# Patient Record
Sex: Male | Born: 1995 | Race: Black or African American | Hispanic: No | Marital: Single | Smoking: Never smoker
Health system: Southern US, Community
[De-identification: ages and names within clinical notes are randomized; demographics above are authoritative.]

## PROBLEM LIST (undated history)

## (undated) DIAGNOSIS — J45909 Unspecified asthma, uncomplicated: Secondary | ICD-10-CM

## (undated) DIAGNOSIS — E669 Obesity, unspecified: Secondary | ICD-10-CM

---
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
## 2018-11-12 ENCOUNTER — Encounter (HOSPITAL_COMMUNITY): Payer: Self-pay

## 2018-11-12 ENCOUNTER — Ambulatory Visit (HOSPITAL_COMMUNITY)
Admission: EM | Admit: 2018-11-12 | Discharge: 2018-11-12 | Disposition: A | Discharge status: Home / Self Care | Payer: PRIVATE HEALTH INSURANCE | Attending: Family Medicine | Admitting: Family Medicine

## 2018-11-12 ENCOUNTER — Other Ambulatory Visit: Payer: Self-pay

## 2018-11-12 DIAGNOSIS — F418 Other specified anxiety disorders: Secondary | ICD-10-CM | POA: Diagnosis not present

## 2018-11-12 DIAGNOSIS — J4520 Mild intermittent asthma, uncomplicated: Secondary | ICD-10-CM

## 2018-11-12 MED ORDER — ALBUTEROL SULFATE (2.5 MG/3ML) 0.083% IN NEBU: 2.5000 mg | INHALATION_SOLUTION | Freq: Four times a day (QID) | RESPIRATORY_TRACT | 1 refills | Status: AC | PRN

## 2018-11-12 NOTE — ED Triage Notes (Signed)
Patient presents to Urgent Care with complaints of feelings of anxiety, reports "not breathing right" since several days ago. Patient states he is not sleeping well either, was seen in past for same.

## 2018-11-14 NOTE — ED Provider Notes (Signed)
[facility]   [id number] 11/12/18 Arrival Time: 1647  ASSESSMENT & PLAN:  1. Situational anxiety   2. Mild intermittent asthma without complication    Meds ordered this encounter  Medications  . albuterol (PROVENTIL) (2.5 MG/3ML) 0.083% nebulizer solution    Sig: Take 3 mLs (2.5 mg total) by nebulization every 6 (six) hours as needed for wheezing or shortness of breath.    Dispense:  75 mL    Refill:  1   Declines sleep aid at this time. Encouraged to est care with PCP. May return here if needed.  Reviewed expectations re: course of current medical issues. Questions answered. Outlined signs and symptoms indicating need for more acute intervention. Patient verbalized understanding. After Visit Summary given.   SUBJECTIVE:  Mathew George is a 23 y.o. male who presents with complaint of:  Anxiety: Questions relation to recent COVID-19 news cycle. No significant h/o anxiety. But this is affecting his sleep. Usually able to fall asleep but wakes up and is unable to fall back asleep. No heavy alcohol use or drug use. No panic attacks. Onset of symptoms over the past week. No specific aggravating or alleviating factors reported. He denies current suicidal and homicidal ideation.  Also with mild intermittent asthma. Recent and mild exacerbation of wheezing with increase in pollen this week. No current SOB/CP. Requests refill of his albuterol inhaler.  ROS: As per HPI. All other systems negative.    OBJECTIVE:  Vitals:   11/12/18 1710 11/12/18 1712  BP:  (!) 147/87  Pulse:  88  Resp:  18  Temp:  99.1 F (37.3 C)  TempSrc:  Oral  SpO2:  100%  Weight: (!) 145.2 kg   Height: 6\' 1"  (1.854 m)     General appearance: alert; no distress Eyes: PERRLA; EOMI; conjunctiva normal HENT: normocephalic; atraumatic Neck: supple Lungs: clear to auscultation bilaterally; no active wheezing Heart: regular rate and rhythm Abdomen: soft, non-tender  Extremities: no edema;  symmetrical with no gross deformities Skin: warm and dry Neurologic: normal gait; normal symmetric reflexes Psychological: alert and cooperative; normal mood and affect  No Known Allergies  Past Medical History:  Diagnosis Date  . Asthma    Social History   Socioeconomic History  . Marital status: Single    Spouse name: Not on file  . Number of children: Not on file  . Years of education: Not on file  . Highest education level: Not on file  Occupational History  . Not on file  Social Needs  . Financial resource strain: Not on file  . Food insecurity:    Worry: Not on file    Inability: Not on file  . Transportation needs:    Medical: Not on file    Non-medical: Not on file  Tobacco Use  . Smoking status: Never Smoker  . Smokeless tobacco: Never Used  Substance and Sexual Activity  . Alcohol use: No  . Drug use: No  . Sexual activity: Not on file  Lifestyle  . Physical activity:    Days per week: Not on file    Minutes per session: Not on file  . Stress: Not on file  Relationships  . Social connections:    Talks on phone: Not on file    Gets together: Not on file    Attends religious service: Not on file    Active member of club or organization: Not on file    Attends meetings of clubs or organizations: Not on file  Relationship status: Not on file  . Intimate partner violence:    Fear of current or ex partner: Not on file    Emotionally abused: Not on file    Physically abused: Not on file    Forced sexual activity: Not on file  Other Topics Concern  . Not on file  Social History Narrative  . Not on file   Family History  Problem Relation Age of Onset  . Diabetes Mother    History reviewed. No pertinent surgical history.    Mardella Layman, MD 11/17/18 [phone]

## 2020-04-06 ENCOUNTER — Encounter (HOSPITAL_COMMUNITY): Payer: Self-pay | Admitting: *Deleted

## 2020-04-06 ENCOUNTER — Other Ambulatory Visit: Payer: Self-pay

## 2020-04-06 ENCOUNTER — Ambulatory Visit (HOSPITAL_COMMUNITY)
Admission: EM | Admit: 2020-04-06 | Discharge: 2020-04-06 | Disposition: A | Discharge status: Home / Self Care | Payer: PRIVATE HEALTH INSURANCE | Attending: Emergency Medicine | Admitting: Emergency Medicine

## 2020-04-06 DIAGNOSIS — H66002 Acute suppurative otitis media without spontaneous rupture of ear drum, left ear: Secondary | ICD-10-CM

## 2020-04-06 DIAGNOSIS — H6121 Impacted cerumen, right ear: Secondary | ICD-10-CM | POA: Diagnosis not present

## 2020-04-06 MED ORDER — AMOXICILLIN 500 MG PO CAPS: 500.0000 mg | ORAL_CAPSULE | Freq: Two times a day (BID) | ORAL | 0 refills | Status: AC

## 2020-04-06 MED ORDER — NEOMYCIN-POLYMYXIN-HC 3.5-10000-1 OT SUSP: 4.0000 [drp] | Freq: Three times a day (TID) | OTIC | 0 refills | Status: AC

## 2020-04-06 NOTE — Discharge Instructions (Signed)
Earwax removed Begin amoxicillin twice daily for the next 10 days to treat ear infection on left side May use Cortisporin eardrops to help with itching/prevent infection developing to outer ear canal May use over-the-counter Debrox for prevention of wax buildup Follow-up for any persistent or worsening symptoms

## 2020-04-06 NOTE — ED Triage Notes (Signed)
Patient reports right ear pain, states he feels like he has wax build up. Patient reports mild discomfort to left ear.   Denies nasal congestion or sore throat.

## 2020-04-07 NOTE — ED Provider Notes (Signed)
[facility]    CSN: [id number] Arrival date & time: 04/06/20  1538      History   Chief Complaint Chief Complaint  Patient presents with  . Otalgia    HPI Mathew George is a 24 y.o. male presenting today for evaluation of right ear pain.  Patient reports today he began to develop pain in his right ear feels as if he has wax buildup.  Reports history of similar.  Has mild discomfort in left ear.  Denies any recent URI symptoms of cough congestion or sore throat.  HPI  Past Medical History:  Diagnosis Date  . Asthma     There are no problems to display for this patient.   History reviewed. No pertinent surgical history.     Home Medications    Prior to Admission medications   Medication Sig Start Date End Date Taking? Authorizing Provider  albuterol (PROVENTIL) (2.5 MG/3ML) 0.083% nebulizer solution Take 3 mLs (2.5 mg total) by nebulization every 6 (six) hours as needed for wheezing or shortness of breath. 11/12/18   Mardella Layman, MD  amoxicillin (AMOXIL) 500 MG capsule Take 1 capsule (500 mg total) by mouth 2 (two) times daily for 10 days. 04/06/20 04/16/20  Roshad Hack C, PA-C  neomycin-polymyxin-hydrocortisone (CORTISPORIN) 3.5-10000-1 OTIC suspension Place 4 drops into both ears 3 (three) times daily for 7 days. 04/06/20 04/13/20  Yarnell Kozloski, Junius Creamer, PA-C    Family History Family History  Problem Relation Age of Onset  . Diabetes Mother     Social History Social History   Tobacco Use  . Smoking status: Never Smoker  . Smokeless tobacco: Never Used  Vaping Use  . Vaping Use: Never used  Substance Use Topics  . Alcohol use: No  . Drug use: No     Allergies   Patient has no known allergies.   Review of Systems Review of Systems  Constitutional: Negative for activity change, appetite change, chills, fatigue and fever.  HENT: Positive for ear pain. Negative for congestion, rhinorrhea, sinus pressure, sore throat and trouble swallowing.     Eyes: Negative for discharge and redness.  Respiratory: Negative for cough, chest tightness and shortness of breath.   Cardiovascular: Negative for chest pain.  Gastrointestinal: Negative for abdominal pain, diarrhea, nausea and vomiting.  Musculoskeletal: Negative for myalgias.  Skin: Negative for rash.  Neurological: Negative for dizziness, light-headedness and headaches.     Physical Exam Triage Vital Signs ED Triage Vitals  Enc Vitals Group     BP 04/06/20 1724 (!) 149/87     Pulse Rate 04/06/20 1724 81     Resp 04/06/20 1724 17     Temp 04/06/20 1724 98.6 F (37 C)     Temp Source 04/06/20 1724 Oral     SpO2 04/06/20 1724 100 %     Weight --      Height --      Head Circumference --      Peak Flow --      Pain Score 04/06/20 1723 4     Pain Loc --      Pain Edu? --      Excl. in GC? --    No data found.  Updated Vital Signs BP (!) 149/87 (BP Location: Right Arm)   Pulse 81   Temp 98.6 F (37 C) (Oral)   Resp 17   SpO2 100%   Visual Acuity Right Eye Distance:   Left Eye Distance:   Bilateral Distance:  Right Eye Near:   Left Eye Near:    Bilateral Near:     Physical Exam Vitals and nursing note reviewed.  Constitutional:      Appearance: He is well-developed.     Comments: No acute distress  HENT:     Head: Normocephalic and atraumatic.     Ears:     Comments: Right ear: External auricle without tenderness or abnormality, canal does appear to have cerumen impaction, TM not initially visualized, after removal of cerumen TM intact and pearly gray, without erythema, good cone of light-canal slightly erythematous  Left ear: Canal without erythema or swelling, TM appears erythematous and dull especially to superior portion, slightly bulging    Nose: Nose normal.  Eyes:     Conjunctiva/sclera: Conjunctivae normal.  Cardiovascular:     Rate and Rhythm: Normal rate.  Pulmonary:     Effort: Pulmonary effort is normal. No respiratory distress.   Abdominal:     General: There is no distension.  Musculoskeletal:        General: Normal range of motion.     Cervical back: Neck supple.  Skin:    General: Skin is warm and dry.  Neurological:     Mental Status: He is alert and oriented to person, place, and time.      UC Treatments / Results  Labs (all labs ordered are listed, but only abnormal results are displayed) Labs Reviewed - No data to display  EKG   Radiology No results found.  Procedures Procedures (including critical care time)  Medications Ordered in UC Medications - No data to display  Initial Impression / Assessment and Plan / UC Course  I have reviewed the triage vital signs and the nursing notes.  Pertinent labs & imaging results that were available during my care of the patient were reviewed by me and considered in my medical decision making (see chart for details).     1.  Right cerumen impaction-irrigation by nursing staff with improvement in pain/discomfort.  Reported itching after removal, provided Cortisporin for prevention of otitis externa as well as the hydrocortisone for the itching.  2.  Left otitis media-treating with amoxicillin  Discussed strict return precautions. Patient verbalized understanding and is agreeable with plan.  Final Clinical Impressions(s) / UC Diagnoses   Final diagnoses:  Impacted cerumen of right ear  Non-recurrent acute suppurative otitis media of left ear without spontaneous rupture of tympanic membrane     Discharge Instructions     Earwax removed Begin amoxicillin twice daily for the next 10 days to treat ear infection on left side May use Cortisporin eardrops to help with itching/prevent infection developing to outer ear canal May use over-the-counter Debrox for prevention of wax buildup Follow-up for any persistent or worsening symptoms   ED Prescriptions    Medication Sig Dispense Auth. Provider   amoxicillin (AMOXIL) 500 MG capsule Take 1  capsule (500 mg total) by mouth 2 (two) times daily for 10 days. 20 capsule Nishika Parkhurst C, PA-C   neomycin-polymyxin-hydrocortisone (CORTISPORIN) 3.5-10000-1 OTIC suspension Place 4 drops into both ears 3 (three) times daily for 7 days. 10 mL Delayni Streed, [place] C, PA-C     PDMP not reviewed this encounter.   Lew Dawes, [place] 04/07/20 [phone]

## 2020-05-04 ENCOUNTER — Ambulatory Visit (INDEPENDENT_AMBULATORY_CARE_PROVIDER_SITE_OTHER): Payer: PRIVATE HEALTH INSURANCE

## 2020-05-04 ENCOUNTER — Ambulatory Visit (HOSPITAL_COMMUNITY)
Admission: EM | Admit: 2020-05-04 | Discharge: 2020-05-04 | Disposition: A | Discharge status: Home / Self Care | Payer: PRIVATE HEALTH INSURANCE | Attending: Family Medicine | Admitting: Family Medicine

## 2020-05-04 ENCOUNTER — Encounter (HOSPITAL_COMMUNITY): Payer: Self-pay | Admitting: *Deleted

## 2020-05-04 ENCOUNTER — Other Ambulatory Visit: Payer: Self-pay

## 2020-05-04 DIAGNOSIS — M79672 Pain in left foot: Secondary | ICD-10-CM

## 2020-05-04 DIAGNOSIS — T148XXA Other injury of unspecified body region, initial encounter: Secondary | ICD-10-CM

## 2020-05-04 HISTORY — DX: Obesity, unspecified: E66.9

## 2020-05-04 MED ORDER — IBUPROFEN 800 MG PO TABS: 800.0000 mg | ORAL_TABLET | Freq: Three times a day (TID) | ORAL | 0 refills | Status: DC | PRN

## 2020-05-04 MED ORDER — CYCLOBENZAPRINE HCL 10 MG PO TABS: 10.0000 mg | ORAL_TABLET | Freq: Two times a day (BID) | ORAL | 0 refills | Status: DC | PRN

## 2020-05-04 NOTE — ED Triage Notes (Signed)
Pt states left dorsal foot started hurting while playing basketball 3 days ago; denies any known mechanism of injury.  Continues with pain, painful ambulation.  Left foot CMS intact.

## 2020-05-04 NOTE — ED Provider Notes (Signed)
[facility]   [id number] 05/04/20 Arrival Time: 1045  EL:FYBOF PAIN  SUBJECTIVE: History from: patient. ESAUL DORWART is a 24 y.o. male complains of left foot pain that began 4 days ago.  Reports that he has not been able to put weight on the left foot.  Reports that the dorsal surface of the lateral foot is swollen, very painful to touch.  Reports that he is taking ibuprofen with temporary relief.  Reports that he has been keeping his foot up, has not used ice or heat.  Denies hearing any popping or clicking when the incident occurred. Symptoms are made worse with activity.  Denies similar symptoms in the past.  Denies fever, chills, erythema, ecchymosis, weakness, numbness and tingling, saddle paresthesias, loss of bowel or bladder function.      ROS: As per HPI.  All other pertinent ROS negative.     Past Medical History:  Diagnosis Date  . Asthma   . Obesity    History reviewed. No pertinent surgical history. No Known Allergies No current facility-administered medications on file prior to encounter.   Current Outpatient Medications on File Prior to Encounter  Medication Sig Dispense Refill  . albuterol (PROVENTIL) (2.5 MG/3ML) 0.083% nebulizer solution Take 3 mLs (2.5 mg total) by nebulization every 6 (six) hours as needed for wheezing or shortness of breath. 75 mL 1   Social History   Socioeconomic History  . Marital status: Single    Spouse name: Not on file  . Number of children: Not on file  . Years of education: Not on file  . Highest education level: Not on file  Occupational History  . Not on file  Tobacco Use  . Smoking status: Never Smoker  . Smokeless tobacco: Never Used  Vaping Use  . Vaping Use: Never used  Substance and Sexual Activity  . Alcohol use: Yes    Comment: occasionally  . Drug use: Never  . Sexual activity: Not on file  Other Topics Concern  . Not on file  Social History Narrative  . Not on file   Social Determinants of Health     Financial Resource Strain:   . Difficulty of Paying Living Expenses: Not on file  Food Insecurity:   . Worried About Programme researcher, broadcasting/film/video in the Last Year: Not on file  . Ran Out of Food in the Last Year: Not on file  Transportation Needs:   . Lack of Transportation (Medical): Not on file  . Lack of Transportation (Non-Medical): Not on file  Physical Activity:   . Days of Exercise per Week: Not on file  . Minutes of Exercise per Session: Not on file  Stress:   . Feeling of Stress : Not on file  Social Connections:   . Frequency of Communication with Friends and Family: Not on file  . Frequency of Social Gatherings with Friends and Family: Not on file  . Attends Religious Services: Not on file  . Active Member of Clubs or Organizations: Not on file  . Attends Banker Meetings: Not on file  . Marital Status: Not on file  Intimate Partner Violence:   . Fear of Current or Ex-Partner: Not on file  . Emotionally Abused: Not on file  . Physically Abused: Not on file  . Sexually Abused: Not on file   Family History  Problem Relation Age of Onset  . Diabetes Mother     OBJECTIVE:  Vitals:   05/04/20 1154  BP: 119/79  Pulse: 79  Temp: 98.5 F (36.9 C)  TempSrc: Oral  SpO2: 99%    General appearance: ALERT; in no acute distress.  Head: NCAT Lungs: Normal respiratory effort CV: pulses 2+ bilaterally. Cap refill < 2 seconds Musculoskeletal:  Inspection: Skin warm, dry, clear and intact without obvious erythema.  Moderate swelling to the lateral aspect of the dorsal surface of the left foot Palpation: Lateral aspect of the dorsal surface of the left foot is tender to palpation ROM: limited ROM active and passive to L ankle Skin: warm and dry Neurologic: Ambulates without difficulty; Sensation intact about the upper/ lower extremities Psychological: alert and cooperative; normal mood and affect  DIAGNOSTIC STUDIES:  No results found.   ASSESSMENT &  PLAN:  1. Left foot pain   2. Sprain and strain     @NIMG @  Meds ordered this encounter  Medications  . ibuprofen (ADVIL) 800 MG tablet    Sig: Take 1 tablet (800 mg total) by mouth every 8 (eight) hours as needed for moderate pain.    Dispense:  21 tablet    Refill:  0    Order Specific Question:   Supervising Provider    Answer:   Merrilee Jansky  . cyclobenzaprine (FLEXERIL) 10 MG tablet    Sig: Take 1 tablet (10 mg total) by mouth 2 (two) times daily as needed for muscle spasms.    Dispense:  20 tablet    Refill:  0    Order Specific Question:   Supervising Provider    Answer:   [id number] Merrilee Jansky   Xray negative Likely sprain ASO brace applied in office  Continue conservative management of rest, ice, and gentle stretches Take ibuprofen as needed for pain relief (may cause abdominal discomfort, ulcers, and GI bleeds avoid taking with other NSAIDs) Take cyclobenzaprine at nighttime for symptomatic relief. Avoid driving or operating heavy machinery while using medication. Follow up with PCP if symptoms persist Return or go to the ER if you have any new or worsening symptoms (fever, chills, chest pain, abdominal pain, changes in bowel or bladder habits, pain radiating into lower legs)   Reviewed expectations re: course of current medical issues. Questions answered. Outlined signs and symptoms indicating need for more acute intervention. Patient verbalized understanding. After Visit Summary given.       [[id number]], NP 05/04/20 1323

## 2020-05-04 NOTE — Discharge Instructions (Addendum)
Take ibuprofen as needed.  Rest and elevate your foot.  Apply ice packs 2-3 times a day for up to 20 minutes each.  Wear the brace as needed for comfort.    Follow up with your primary care provider or an orthopedist if you symptoms continue or worsen;  Or if you develop new symptoms, such as numbness, tingling, or weakness.

## 2022-06-12 ENCOUNTER — Emergency Department (HOSPITAL_BASED_OUTPATIENT_CLINIC_OR_DEPARTMENT_OTHER)
Admission: EM | Admit: 2022-06-12 | Discharge: 2022-06-12 | Disposition: A | Discharge status: Home / Self Care | Payer: PRIVATE HEALTH INSURANCE | Attending: Emergency Medicine | Admitting: Emergency Medicine

## 2022-06-12 DIAGNOSIS — S39012A Strain of muscle, fascia and tendon of lower back, initial encounter: Secondary | ICD-10-CM | POA: Diagnosis not present

## 2022-06-12 DIAGNOSIS — M25521 Pain in right elbow: Secondary | ICD-10-CM | POA: Diagnosis not present

## 2022-06-12 DIAGNOSIS — M545 Low back pain, unspecified: Secondary | ICD-10-CM | POA: Diagnosis present

## 2022-06-12 DIAGNOSIS — Z7951 Long term (current) use of inhaled steroids: Secondary | ICD-10-CM | POA: Insufficient documentation

## 2022-06-12 DIAGNOSIS — J45909 Unspecified asthma, uncomplicated: Secondary | ICD-10-CM | POA: Insufficient documentation

## 2022-06-12 DIAGNOSIS — Y92410 Unspecified street and highway as the place of occurrence of the external cause: Secondary | ICD-10-CM | POA: Insufficient documentation

## 2022-06-12 MED ORDER — LIDOCAINE 5 % EX PTCH: 1.0000 | MEDICATED_PATCH | CUTANEOUS | 0 refills | Status: AC

## 2022-06-12 NOTE — Discharge Instructions (Signed)
Your exam today is overall reassuring.  You likely have a muscle strain of your right low back as well as the contusion of your right elbow.  Continue taking the anti-inflammatory as you were prescribed.  I have sent lidocaine patch into the pharmacy for you as well as given you follow-up with sports med doctor listed above.  You requested a sling which we have provided for you.  Ensure that you take your arm out of the sling at least 5 times to do range of motion exercises to prevent frozen shoulder.

## 2022-06-12 NOTE — ED Notes (Signed)
Discharge paperwork given and verbally understood. 
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
## 2022-06-16 ENCOUNTER — Ambulatory Visit: Payer: Self-pay

## 2022-06-16 ENCOUNTER — Ambulatory Visit (INDEPENDENT_AMBULATORY_CARE_PROVIDER_SITE_OTHER): Payer: PRIVATE HEALTH INSURANCE | Admitting: Family Medicine

## 2022-06-16 VITALS — BP 159/90 | Ht 74.0 in | Wt 349.0 lb

## 2022-06-16 DIAGNOSIS — M25531 Pain in right wrist: Secondary | ICD-10-CM

## 2022-06-16 DIAGNOSIS — M545 Low back pain, unspecified: Secondary | ICD-10-CM

## 2022-06-16 MED ORDER — MELOXICAM 15 MG PO TABS: 15.0000 mg | ORAL_TABLET | Freq: Every day | ORAL | 2 refills | Status: AC
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
## 2022-06-17 ENCOUNTER — Encounter: Payer: Self-pay | Admitting: Family Medicine
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
## 2022-06-24 IMAGING — DX DG FOOT COMPLETE 3+V*L*
3 series · 3 of 3 positions shown · non-contrast
Comparison: None.

CLINICAL DATA: Left foot pain for the past 3 days after playing
basketball.

EXAM:
LEFT FOOT - COMPLETE 3+ VIEW

[foot ap]
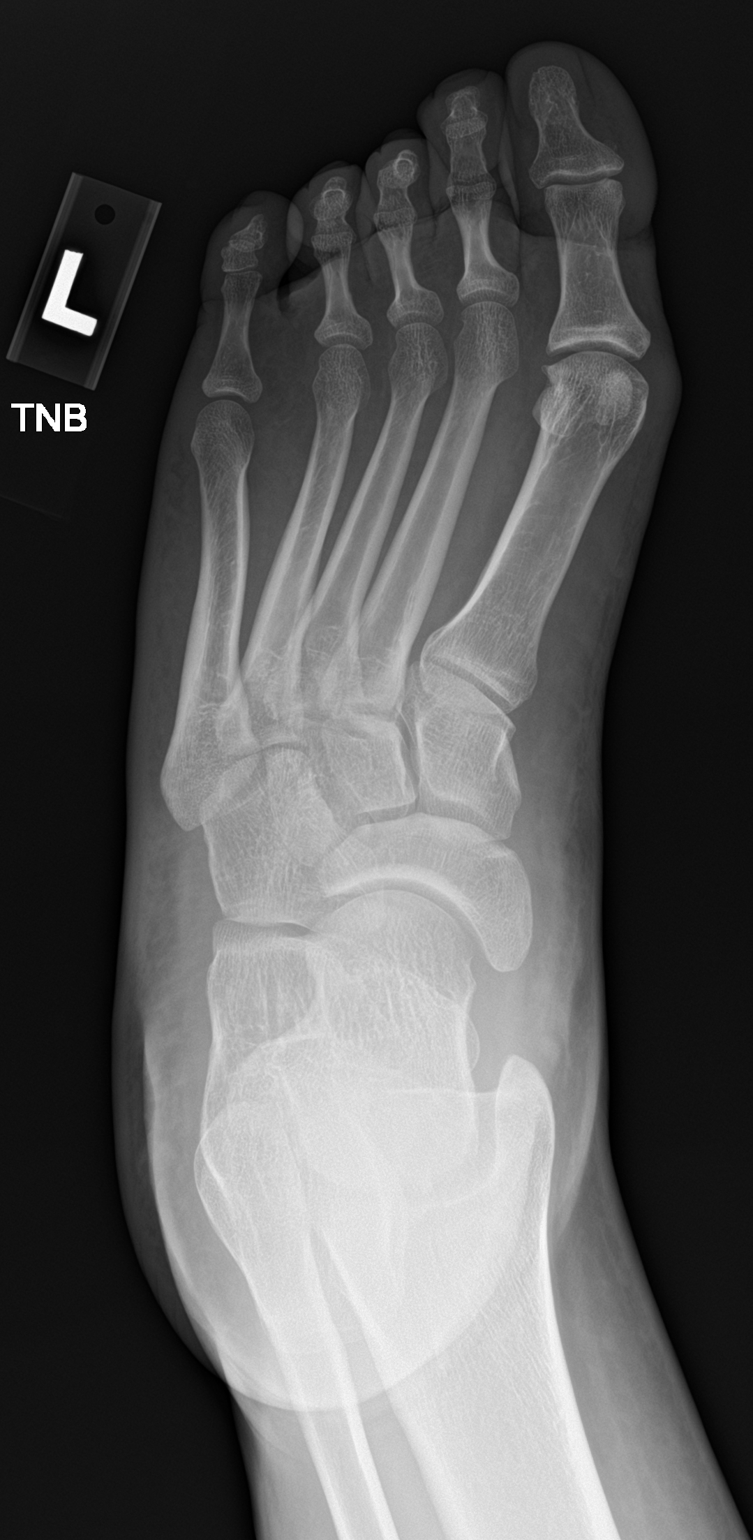

[foot obl]
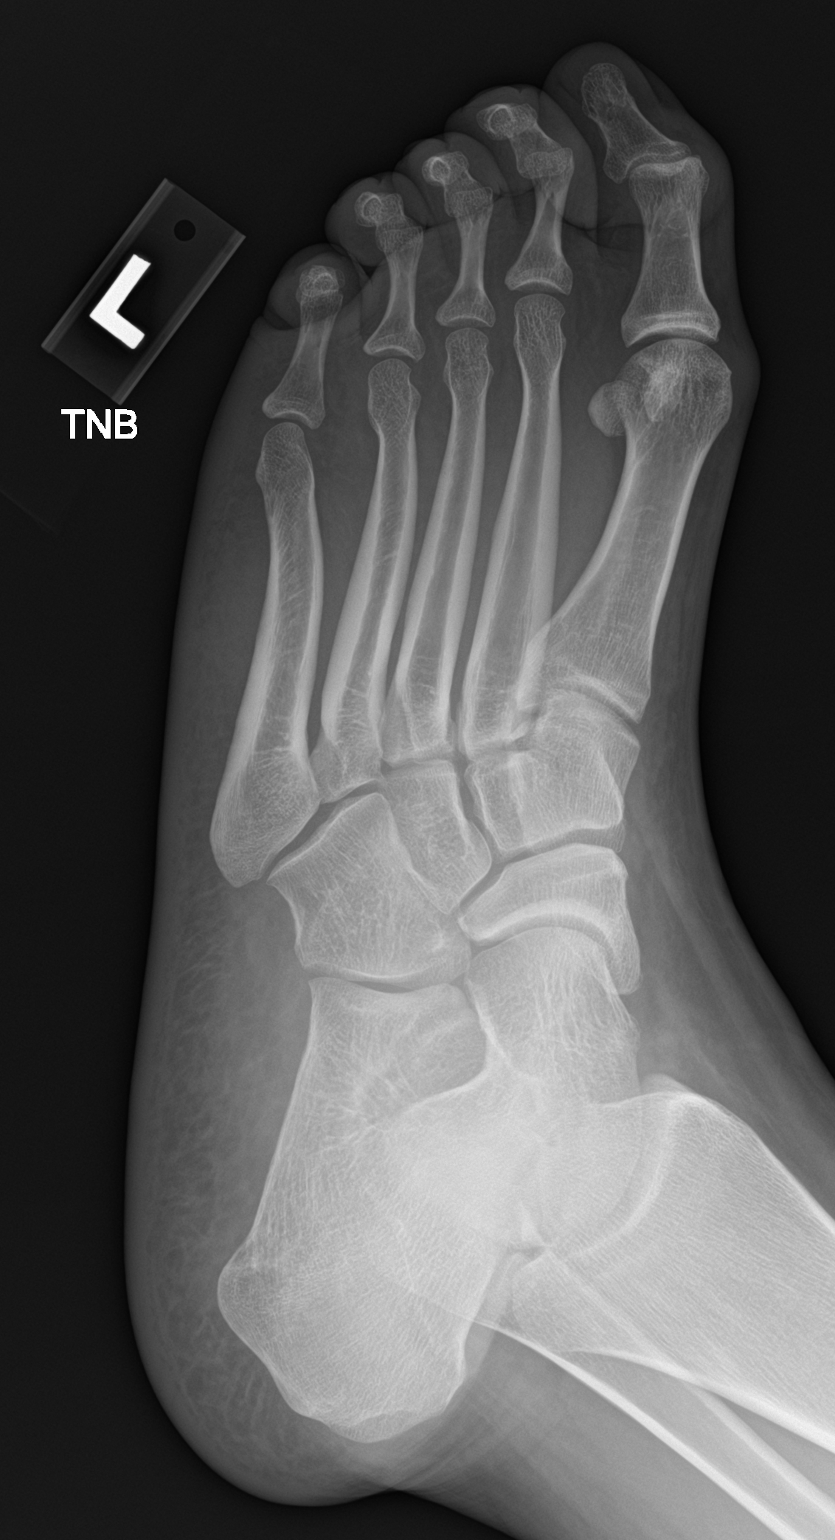

[foot lat]
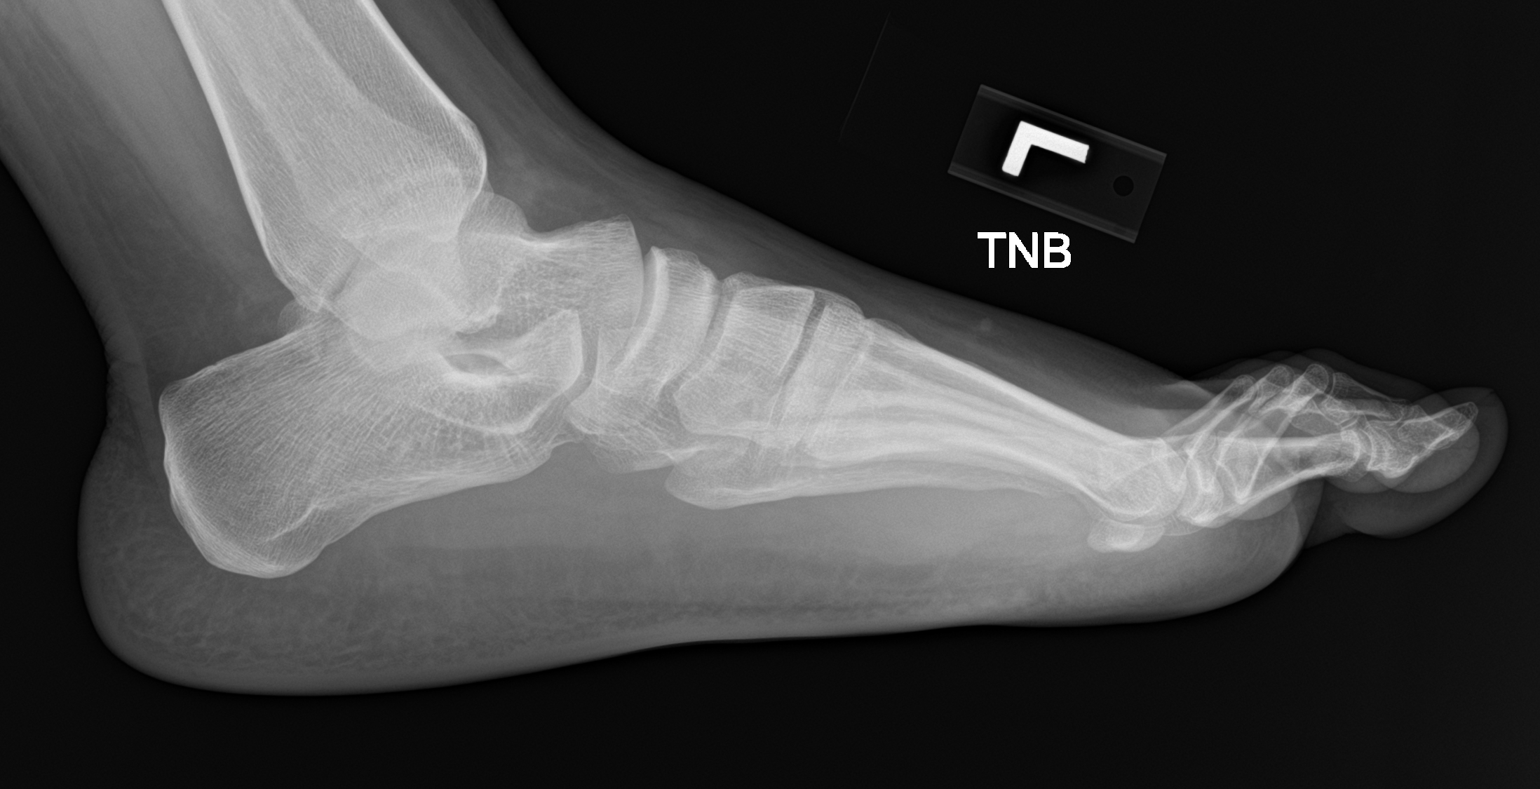

[3 of 3 positions shown; findings below may reference images not displayed]

FINDINGS: No fracture or dislocation. Mild hallux valgus deformity with
associated mild degenerative change the first MTP joint with joint
space loss, subchondral sclerosis and osteophytosis. Joint spaces
otherwise well preserved. No erosions. No plantar calcaneal spur.
Regional soft tissues appear normal.
IMPRESSION: 1. Mild hallux valgus deformity with associated mild degenerative
change the first MTP joint.
2. Otherwise, no explanation for patient's foot pain.

## 2022-07-08 ENCOUNTER — Other Ambulatory Visit: Payer: Self-pay

## 2022-07-08 ENCOUNTER — Ambulatory Visit (HOSPITAL_BASED_OUTPATIENT_CLINIC_OR_DEPARTMENT_OTHER): Payer: Self-pay | Attending: Family Medicine | Admitting: Physical Therapy

## 2022-07-08 ENCOUNTER — Encounter (HOSPITAL_BASED_OUTPATIENT_CLINIC_OR_DEPARTMENT_OTHER): Payer: Self-pay | Admitting: Physical Therapy

## 2022-07-08 DIAGNOSIS — M25531 Pain in right wrist: Secondary | ICD-10-CM | POA: Insufficient documentation

## 2022-07-08 DIAGNOSIS — M6281 Muscle weakness (generalized): Secondary | ICD-10-CM | POA: Insufficient documentation

## 2022-07-08 DIAGNOSIS — M25641 Stiffness of right hand, not elsewhere classified: Secondary | ICD-10-CM | POA: Insufficient documentation

## 2022-07-08 DIAGNOSIS — M25631 Stiffness of right wrist, not elsewhere classified: Secondary | ICD-10-CM | POA: Insufficient documentation

## 2022-07-08 DIAGNOSIS — M79631 Pain in right forearm: Secondary | ICD-10-CM | POA: Insufficient documentation
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
## 2022-07-15 ENCOUNTER — Ambulatory Visit (INDEPENDENT_AMBULATORY_CARE_PROVIDER_SITE_OTHER): Payer: PRIVATE HEALTH INSURANCE | Admitting: Family Medicine

## 2022-07-15 VITALS — BP 137/85 | Ht 74.0 in | Wt 349.0 lb

## 2022-07-15 DIAGNOSIS — M545 Low back pain, unspecified: Secondary | ICD-10-CM
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
## 2022-07-23 ENCOUNTER — Ambulatory Visit (HOSPITAL_BASED_OUTPATIENT_CLINIC_OR_DEPARTMENT_OTHER): Payer: Self-pay | Attending: Family Medicine | Admitting: Physical Therapy

## 2022-07-23 ENCOUNTER — Encounter (HOSPITAL_BASED_OUTPATIENT_CLINIC_OR_DEPARTMENT_OTHER): Payer: Self-pay | Admitting: Physical Therapy

## 2022-07-23 DIAGNOSIS — M6281 Muscle weakness (generalized): Secondary | ICD-10-CM | POA: Insufficient documentation

## 2022-07-23 DIAGNOSIS — M545 Low back pain, unspecified: Secondary | ICD-10-CM | POA: Insufficient documentation

## 2022-07-23 DIAGNOSIS — M25641 Stiffness of right hand, not elsewhere classified: Secondary | ICD-10-CM | POA: Insufficient documentation

## 2022-07-23 DIAGNOSIS — M25631 Stiffness of right wrist, not elsewhere classified: Secondary | ICD-10-CM | POA: Insufficient documentation

## 2022-07-23 DIAGNOSIS — M79631 Pain in right forearm: Secondary | ICD-10-CM | POA: Insufficient documentation
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
## 2022-07-30 ENCOUNTER — Encounter (HOSPITAL_BASED_OUTPATIENT_CLINIC_OR_DEPARTMENT_OTHER): Payer: Self-pay | Admitting: Physical Therapy

## 2022-07-30 ENCOUNTER — Ambulatory Visit (HOSPITAL_BASED_OUTPATIENT_CLINIC_OR_DEPARTMENT_OTHER): Payer: Self-pay | Admitting: Physical Therapy

## 2022-07-30 DIAGNOSIS — M25641 Stiffness of right hand, not elsewhere classified: Secondary | ICD-10-CM

## 2022-07-30 DIAGNOSIS — M6281 Muscle weakness (generalized): Secondary | ICD-10-CM

## 2022-07-30 DIAGNOSIS — M79631 Pain in right forearm: Secondary | ICD-10-CM

## 2022-07-30 DIAGNOSIS — M25631 Stiffness of right wrist, not elsewhere classified: Secondary | ICD-10-CM
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
## 2022-08-04 ENCOUNTER — Ambulatory Visit: Payer: PRIVATE HEALTH INSURANCE | Admitting: Family Medicine

## 2022-08-05 ENCOUNTER — Ambulatory Visit (INDEPENDENT_AMBULATORY_CARE_PROVIDER_SITE_OTHER): Payer: Self-pay | Admitting: Family Medicine

## 2022-08-05 ENCOUNTER — Encounter: Payer: Self-pay | Admitting: Family Medicine

## 2022-08-05 VITALS — BP 142/102 | Ht 74.0 in | Wt 350.0 lb

## 2022-08-05 DIAGNOSIS — M25531 Pain in right wrist: Secondary | ICD-10-CM

## 2022-08-05 DIAGNOSIS — M545 Low back pain, unspecified: Secondary | ICD-10-CM
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
## 2022-08-05 NOTE — Patient Instructions (Addendum)
Dr Sabino [facility]  [facility] [address] [place] [address] [place] [address] [phone]

## 2022-08-06 ENCOUNTER — Ambulatory Visit (HOSPITAL_BASED_OUTPATIENT_CLINIC_OR_DEPARTMENT_OTHER): Payer: Self-pay | Admitting: Physical Therapy

## 2022-08-06 ENCOUNTER — Telehealth (HOSPITAL_BASED_OUTPATIENT_CLINIC_OR_DEPARTMENT_OTHER): Payer: Self-pay | Admitting: Physical Therapy

## 2022-08-06 ENCOUNTER — Encounter (HOSPITAL_BASED_OUTPATIENT_CLINIC_OR_DEPARTMENT_OTHER): Payer: Self-pay

## 2022-08-06 NOTE — Telephone Encounter (Signed)
No-show for today's PT appointment; called and left VM regarding this visit and time/date of next scheduled session.  Lerry Liner PT DPT PN2

## 2022-08-13 ENCOUNTER — Ambulatory Visit (HOSPITAL_BASED_OUTPATIENT_CLINIC_OR_DEPARTMENT_OTHER): Payer: Self-pay | Admitting: Physical Therapy

## 2022-08-20 ENCOUNTER — Ambulatory Visit (HOSPITAL_BASED_OUTPATIENT_CLINIC_OR_DEPARTMENT_OTHER): Payer: PRIVATE HEALTH INSURANCE | Attending: Family Medicine | Admitting: Physical Therapy

## 2022-08-20 DIAGNOSIS — M25641 Stiffness of right hand, not elsewhere classified: Secondary | ICD-10-CM | POA: Insufficient documentation

## 2022-08-20 DIAGNOSIS — M79631 Pain in right forearm: Secondary | ICD-10-CM | POA: Insufficient documentation

## 2022-08-20 DIAGNOSIS — M25631 Stiffness of right wrist, not elsewhere classified: Secondary | ICD-10-CM | POA: Insufficient documentation

## 2022-08-20 DIAGNOSIS — M6281 Muscle weakness (generalized): Secondary | ICD-10-CM | POA: Insufficient documentation

## 2022-08-26 ENCOUNTER — Encounter (HOSPITAL_BASED_OUTPATIENT_CLINIC_OR_DEPARTMENT_OTHER): Payer: Self-pay | Admitting: Physical Therapy

## 2022-08-26 ENCOUNTER — Ambulatory Visit (HOSPITAL_BASED_OUTPATIENT_CLINIC_OR_DEPARTMENT_OTHER): Payer: Self-pay | Admitting: Physical Therapy

## 2022-08-26 DIAGNOSIS — M6281 Muscle weakness (generalized): Secondary | ICD-10-CM

## 2022-08-26 DIAGNOSIS — M79631 Pain in right forearm: Secondary | ICD-10-CM

## 2022-08-26 DIAGNOSIS — M25631 Stiffness of right wrist, not elsewhere classified: Secondary | ICD-10-CM

## 2022-08-26 DIAGNOSIS — M25641 Stiffness of right hand, not elsewhere classified: Secondary | ICD-10-CM
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
## 2022-11-12 ENCOUNTER — Encounter (HOSPITAL_BASED_OUTPATIENT_CLINIC_OR_DEPARTMENT_OTHER): Payer: Self-pay | Admitting: Emergency Medicine

## 2022-11-12 ENCOUNTER — Other Ambulatory Visit: Payer: Self-pay

## 2022-11-12 ENCOUNTER — Emergency Department (HOSPITAL_BASED_OUTPATIENT_CLINIC_OR_DEPARTMENT_OTHER)
Admission: EM | Admit: 2022-11-12 | Discharge: 2022-11-12 | Disposition: A | Discharge status: Home / Self Care | Payer: PRIVATE HEALTH INSURANCE | Attending: Emergency Medicine | Admitting: Emergency Medicine

## 2022-11-12 DIAGNOSIS — H9201 Otalgia, right ear: Secondary | ICD-10-CM | POA: Diagnosis not present

## 2022-11-12 MED ORDER — AMOXICILLIN 500 MG PO CAPS: 500.0000 mg | ORAL_CAPSULE | Freq: Three times a day (TID) | ORAL | 0 refills | Status: AC

## 2022-11-12 MED ORDER — OXYMETAZOLINE HCL 0.05 % NA SOLN
1.0000 | Freq: Once | NASAL | Status: AC
  Administered 2022-11-12: 1 via NASAL
  Filled 2022-11-12: qty 30

## 2022-11-12 MED ORDER — CETIRIZINE HCL 10 MG PO TABS: 10.0000 mg | ORAL_TABLET | Freq: Every day | ORAL | 0 refills | Status: AC

## 2022-11-12 NOTE — ED Triage Notes (Signed)
Pt arrives to ED with c/o x2 days of right sided otalgia.

## 2022-11-12 NOTE — Discharge Instructions (Addendum)
Blood pressure is elevated here in the emergency department. Please follow-up with your primary care doctor for recheck of your blood pressure and possibly starting medications later this week. Your ear did not appear to have have any wax in the canal.  You are being given a prescription for antibiotics due to some fluid in the middle ear. You were given a dose of Afrin here in the emergency department.  You may use this twice a day for up to 3 days and then you should be discontinued

## 2022-11-12 NOTE — ED Provider Notes (Signed)
[facility] Provider Note   CSN: [id number] Arrival date & time: 11/12/22  1035     History  Chief Complaint  Patient presents with   Otalgia    Mathew George is a 27 y.o. male.  HPI 27 year old male presents today complaining of right ear pain.  He feels that it is clogged up and will pop occasionally.  He states he has had this happen before and has had resolution with irrigation of ear.  He has not had any congestion, cough, or fever.  He states that the person who saw him before May told him that he had an ear infection.     Home Medications Prior to Admission medications   Medication Sig Start Date End Date Taking? Authorizing Provider  amoxicillin (AMOXIL) 500 MG capsule Take 1 capsule (500 mg total) by mouth 3 (three) times daily. 11/12/22  Yes Pattricia Boss, MD  cetirizine (ZYRTEC) 10 MG tablet Take 1 tablet (10 mg total) by mouth daily. 11/12/22  Yes Pattricia Boss, MD  albuterol (PROVENTIL) (2.5 MG/3ML) 0.083% nebulizer solution Take 3 mLs (2.5 mg total) by nebulization every 6 (six) hours as needed for wheezing or shortness of breath. 11/12/18   Vanessa Kick, MD  lidocaine (LIDODERM) 5 % Place 1 patch onto the skin daily. Remove & Discard patch within 12 hours or as directed by MD 06/12/22   Evlyn Courier, PA-C  meloxicam (MOBIC) 15 MG tablet Take 1 tablet (15 mg total) by mouth daily. 06/16/22   Dene Gentry, MD      Allergies    Patient has no known allergies.    Review of Systems   Review of Systems  Physical Exam Updated Vital Signs BP (!) 160/105 (BP Location: Right Arm)   Pulse 81   Temp 98.7 F (37.1 C) (Oral)   Resp 15   Ht 1.88 m (6\' 2" )   Wt (!) 158.8 kg   SpO2 100%   BMI 44.94 kg/m  Physical Exam Vitals reviewed.  Constitutional:      Appearance: He is obese.  HENT:     Head: Normocephalic and atraumatic.     Right Ear: Ear canal and external ear normal. There is no impacted cerumen.     Ears:      Comments: Some fluid behind right TM.    Nose: Nose normal.     Mouth/Throat:     Mouth: Mucous membranes are moist.     Pharynx: Oropharynx is clear.  Eyes:     Pupils: Pupils are equal, round, and reactive to light.  Cardiovascular:     Rate and Rhythm: Normal rate.  Pulmonary:     Effort: Pulmonary effort is normal.  Musculoskeletal:     Cervical back: Normal range of motion.  Skin:    General: Skin is warm and dry.  Neurological:     General: No focal deficit present.     Mental Status: He is alert.  Psychiatric:        Mood and Affect: Mood normal.     ED Results / Procedures / Treatments   Labs (all labs ordered are listed, but only abnormal results are displayed) Labs Reviewed - No data to display  EKG None  Radiology No results found.  Procedures Procedures    Medications Ordered in ED Medications  oxymetazoline (AFRIN) 0.05 % nasal spray 1 spray (has no administration in time range)    ED Course/ Medical Decision Making/ A&P  Medical Decision Making Risk OTC drugs. Prescription drug management.   Presents today with right ear pain. On exam patient does have some flaking white substance in the external canal but no evidence of impacted cerumen.  I can clearly visualize his tympanic membrane.  There is some fluid behind the tympanic membrane.  I suspect that this is more of a serous otitis media.  He is being given Afrin here and started on amoxicillin and cetirizine. Additionally is noted that his blood pressure is elevated here at 160/100.  Does not have any noted prior history of hypertension. He is advised regarding need for close close follow-up and possible outpatient medication being started.       Final Clinical Impression(s) / ED Diagnoses Final diagnoses:  Otalgia, right    Rx / DC Orders ED Discharge Orders          Ordered    amoxicillin (AMOXIL) 500 MG capsule  3 times daily        11/12/22 1059     cetirizine (ZYRTEC) 10 MG tablet  Daily        11/12/22 1101              Pattricia Boss, MD 11/12/22 1104

## 2022-11-12 NOTE — ED Notes (Signed)
Patient verbalizes understanding of discharge instructions. Opportunity for questioning and answers were provided. Patient discharged from ED.  °

## 2023-06-21 ENCOUNTER — Ambulatory Visit (INDEPENDENT_AMBULATORY_CARE_PROVIDER_SITE_OTHER): Payer: PRIVATE HEALTH INSURANCE | Admitting: Family Medicine

## 2023-06-21 ENCOUNTER — Encounter: Payer: Self-pay | Admitting: Family Medicine

## 2023-06-21 VITALS — BP 151/94 | Ht 74.0 in | Wt 350.0 lb

## 2023-06-21 DIAGNOSIS — M25531 Pain in right wrist: Secondary | ICD-10-CM | POA: Diagnosis not present

## 2023-06-21 DIAGNOSIS — M545 Low back pain, unspecified: Secondary | ICD-10-CM | POA: Diagnosis not present

## 2023-06-21 NOTE — Progress Notes (Signed)
PCP: Patient, No Pcp Per  Subjective:   HPI: Patient is a 27 y.o. male here for right forearm, low back.  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 Have not bothered him in a long time. No weakness, numbness, radiation. No current complaints.  Past Medical History:  Diagnosis Date   Asthma    Obesity     Current Outpatient Medications on File Prior to Visit  Medication Sig Dispense Refill   albuterol (PROVENTIL) (2.5 MG/3ML) 0.083% nebulizer solution Take 3 mLs (2.5 mg total) by nebulization every 6 (six) hours as needed for wheezing or shortness of breath. 75 mL 1   amoxicillin (AMOXIL) 500 MG capsule Take 1 capsule (500 mg total) by mouth 3 (three) times daily. 21 capsule 0   cetirizine (ZYRTEC) 10 MG tablet Take 1 tablet (10 mg total) by mouth daily. 30 tablet 0   lidocaine (LIDODERM) 5 % Place 1 patch onto the skin daily. Remove & Discard patch within 12 hours or as directed by MD 14 patch 0   meloxicam (MOBIC) 15 MG tablet Take 1 tablet (15 mg total) by mouth daily. 30 tablet 2   No current facility-administered medications on file prior to visit.    History reviewed. No pertinent surgical history.  No Known Allergies  BP (!) 151/94   Ht 6\' 2"  (1.88 m)   Wt (!) 350 lb (158.8 kg)   BMI 44.94 kg/m       No data to display              No data to display              Objective:  Physical Exam:  Gen: NAD, comfortable in exam room  Right elbow/forearm: No deformity. FROM with 5/5 strength. No tenderness to palpation. NVI distally.  Back: No gross deformity, scoliosis. No paraspinal tenderness.  No midline or bony TTP. FROM. Strength LEs 5/5 all muscle groups.   Negative SLRs. Sensation intact to light touch bilaterally.   Assessment & Plan:  1. Right forearm pain - resolved. No issues.  Fully cleared for work.  2. Low back pain - resolved.  Fully cleared for work.
